# Patient Record
Sex: Female | Born: 1965 | Hispanic: Yes | Marital: Married
Health system: Southern US, Community
[De-identification: ages and names within clinical notes are randomized; demographics above are authoritative.]

## PROBLEM LIST (undated history)

## (undated) HISTORY — PX: BREAST EXCISIONAL BIOPSY: SUR124

---

## 2004-11-09 ENCOUNTER — Ambulatory Visit: Payer: Self-pay

## 2005-07-06 ENCOUNTER — Ambulatory Visit: Payer: Self-pay

## 2006-05-24 ENCOUNTER — Ambulatory Visit: Payer: Self-pay | Admitting: Family Medicine

## 2015-03-21 HISTORY — PX: BREAST EXCISIONAL BIOPSY: SUR124

## 2015-07-14 ENCOUNTER — Ambulatory Visit: Payer: Self-pay | Attending: Oncology

## 2015-07-14 ENCOUNTER — Ambulatory Visit
Admission: RE | Admit: 2015-07-14 | Discharge: 2015-07-14 | Disposition: A | Discharge status: Home / Self Care | Payer: Self-pay | Source: Ambulatory Visit | Attending: Oncology | Admitting: Oncology

## 2015-07-14 VITALS — BP 133/78 | HR 55 | Temp 97.8°F | Resp 16 | Ht 64.57 in | Wt 167.4 lb

## 2015-07-14 DIAGNOSIS — Z0000 Encounter for general adult medical examination without abnormal findings: Secondary | ICD-10-CM

## 2015-07-14 NOTE — Progress Notes (Signed)
Subjective:     Patient ID: Diamantina MonksSalustia Pantaleon Santos, female   DOB: 02/19/1966, 50 y.o.   MRN: [id number]  HPI   Review of Systems     Objective:   Physical Exam  Pulmonary/Chest: Right breast exhibits no inverted nipple, no mass, no nipple discharge, no skin change and no tenderness. Left breast exhibits no inverted nipple, no mass, no nipple discharge, no skin change and no tenderness. Breasts are symmetrical.       Assessment:     50 [facility]  Patient screened, and meets [facility]CP eligibility.  Patient does not have insurance, Medicare or Medicaid.  Handout given on Affordable Care Act.  Instructed patient on breast self-exam using teach back method.  CBE unremarkable.  Claretha CooperLoyda Murr interpreted exam.  Patient had a mamm[facility] here in 2007, and [facility].    Plan:     Sent for bilateral screening mammogram.

## 2015-07-19 ENCOUNTER — Other Ambulatory Visit: Payer: Self-pay

## 2015-07-19 DIAGNOSIS — N63 Unspecified lump in unspecified breast: Secondary | ICD-10-CM

## 2015-08-20 ENCOUNTER — Ambulatory Visit
Admission: RE | Admit: 2015-08-20 | Discharge: 2015-08-20 | Disposition: A | Discharge status: Home / Self Care | Payer: Self-pay | Source: Ambulatory Visit | Attending: Oncology | Admitting: Oncology

## 2015-08-20 DIAGNOSIS — N63 Unspecified lump in unspecified breast: Secondary | ICD-10-CM

## 2015-08-24 ENCOUNTER — Other Ambulatory Visit: Payer: Self-pay

## 2015-08-24 DIAGNOSIS — N63 Unspecified lump in unspecified breast: Secondary | ICD-10-CM

## 2015-09-06 ENCOUNTER — Ambulatory Visit: Payer: Self-pay

## 2015-09-06 ENCOUNTER — Ambulatory Visit
Admission: RE | Admit: 2015-09-06 | Discharge: 2015-09-06 | Disposition: A | Discharge status: Home / Self Care | Payer: Self-pay | Source: Ambulatory Visit | Attending: Oncology | Admitting: Oncology

## 2015-09-06 ENCOUNTER — Ambulatory Visit: Admission: RE | Admit: 2015-09-06 | Payer: Self-pay | Source: Ambulatory Visit

## 2015-09-06 DIAGNOSIS — N63 Unspecified lump in unspecified breast: Secondary | ICD-10-CM

## 2015-09-07 LAB — SURGICAL PATHOLOGY

## 2015-09-09 ENCOUNTER — Encounter: Payer: Self-pay | Admitting: *Deleted

## 2015-09-09 NOTE — Progress Notes (Unsigned)
Biopsy results showed atypical vascular lesion.  Radiologist recommended surgical  excision .  Scheduled consult with Dr. Lemar LivingsByrnett on September 28, 2015 at 3:45.  Patient to arrive at 3:30 with photo ID, and current medications.  Mailed appointment instructions to patient.  Lindsey Cornertto Hernandez interpreted phone call.

## 2015-09-28 ENCOUNTER — Ambulatory Visit: Payer: Self-pay | Admitting: General Surgery

## 2015-09-29 NOTE — Progress Notes (Signed)
Patient had benign vascular lesion on breast biopsy of 09/06/15.  Scheduled appointment for consult with Dr. Lemar LivingsByrnett on 09/28/15 to discuss excision with Robert BellowMaritza Afandor interpreting phone call.  Explained to patient that [facility]CP would not cover excision if a non-cancerous lesion.  Noticed appointment was cancelled.  Per [facility] at [facility], patient called with interpreter on the phone, and cancelled this consult.  [facility]  Copy to [facility].

## 2017-01-24 ENCOUNTER — Encounter (INDEPENDENT_AMBULATORY_CARE_PROVIDER_SITE_OTHER): Payer: Self-pay

## 2017-01-24 ENCOUNTER — Ambulatory Visit
Admission: RE | Admit: 2017-01-24 | Discharge: 2017-01-24 | Disposition: A | Discharge status: Home / Self Care | Payer: Self-pay | Source: Ambulatory Visit | Attending: Oncology | Admitting: Oncology

## 2017-01-24 ENCOUNTER — Ambulatory Visit: Payer: Self-pay | Attending: Oncology

## 2017-01-24 VITALS — BP 129/73 | HR 69 | Temp 96.6°F | Resp 18 | Ht 65.0 in | Wt 165.0 lb

## 2017-01-24 DIAGNOSIS — Z0000 Encounter for general adult medical examination without abnormal findings: Secondary | ICD-10-CM

## 2017-01-24 NOTE — Progress Notes (Signed)
Subjective:     Patient ID: Diamantina MonksSalustia Pantaleon Santos, female   DOB: 1965/09/05, 51 y.o.   MRN: [id number]  HPI   Review of Systems     Objective:   Physical Exam  Pulmonary/Chest: Right breast exhibits no inverted nipple, no mass, no nipple discharge, no skin change and no tenderness. Left breast exhibits no inverted nipple, no mass, no nipple discharge, no skin change and no tenderness. Breasts are symmetrical.    Left breast biopsy scar; horizontal right nipple inversion        Assessment:     51 year old hispanic patient presents for [facility] clinic visit.  Patient screened, and meets BCCCP eligibility.  Patient does not have insurance, Medicare or Medicaid.  Handout given on Affordable Care Act.  Instructed patient on breast self-exam using teach back method.  [facility].  CBE unremarkable.  No mass or lump palpated Jaqui Laukaitis interpreted exam.    Plan:     Sent for bilateral screening mammogram.

## 2017-01-31 NOTE — Progress Notes (Signed)
Letter mailed from [facility] to notify of normal mammogram results.  Patient to return in one year for annual screening.  Copy to [facility]. 

## 2018-11-12 ENCOUNTER — Other Ambulatory Visit: Payer: Self-pay

## 2018-11-13 ENCOUNTER — Ambulatory Visit
Admission: RE | Admit: 2018-11-13 | Discharge: 2018-11-13 | Disposition: A | Discharge status: Home / Self Care | Payer: Self-pay | Source: Ambulatory Visit | Attending: Oncology | Admitting: Oncology

## 2018-11-13 ENCOUNTER — Other Ambulatory Visit: Payer: Self-pay | Admitting: *Deleted

## 2018-11-13 ENCOUNTER — Other Ambulatory Visit: Payer: Self-pay

## 2018-11-13 ENCOUNTER — Encounter: Payer: Self-pay | Admitting: *Deleted

## 2018-11-13 ENCOUNTER — Ambulatory Visit: Payer: Self-pay | Attending: Oncology | Admitting: *Deleted

## 2018-11-13 ENCOUNTER — Encounter (INDEPENDENT_AMBULATORY_CARE_PROVIDER_SITE_OTHER): Payer: Self-pay

## 2018-11-13 VITALS — BP 135/78 | HR 59 | Temp 96.4°F | Resp 16 | Ht 65.75 in | Wt 170.4 lb

## 2018-11-13 DIAGNOSIS — N6489 Other specified disorders of breast: Secondary | ICD-10-CM

## 2018-11-13 DIAGNOSIS — Z0000 Encounter for general adult medical examination without abnormal findings: Secondary | ICD-10-CM | POA: Insufficient documentation

## 2018-11-13 NOTE — Progress Notes (Signed)
  Subjective:     Patient ID: Lindsey Hernandez, female   DOB: [birth year], 53 y.o.   MRN: [id number]  HPI   Review of Systems     Objective:   Physical Exam Chest:     Breasts:        Right: No swelling, bleeding, inverted nipple, mass, nipple discharge, skin change or tenderness.        Left: No swelling, bleeding, inverted nipple, mass, nipple discharge, skin change or tenderness.    Lymphadenopathy:     Upper Body:     Right upper body: No supraclavicular or axillary adenopathy.     Left upper body: No supraclavicular or axillary adenopathy.        Assessment:     53 year old Hispanic female presents to [facility] for clinical breast exam and mammogram only.  Lindsey Hernandez, the interpreter present during the interview and exam.  States she got her pap results yesterday and they were normal.  She is to follow up with next pap per ASCCP guidelines.  Clinical breast exam unremarkable.  Taught self breast awareness.  Tyer-Cuzick breast risk model score is 10.5% lifetime risk of breast cancer.  Patient has been screened for eligibility.  She does not have any insurance, Medicare or Medicaid.  She also meets financial eligibility.  Hand-out given on the Affordable Care Act.    Plan:     Screening mammogram ordered.  Will follow up per [facility] protocol.

## 2018-11-27 ENCOUNTER — Ambulatory Visit
Admission: RE | Admit: 2018-11-27 | Discharge: 2018-11-27 | Disposition: A | Discharge status: Home / Self Care | Payer: Self-pay | Source: Ambulatory Visit | Attending: Oncology | Admitting: Oncology

## 2018-11-27 DIAGNOSIS — N6489 Other specified disorders of breast: Secondary | ICD-10-CM | POA: Insufficient documentation

## 2018-11-28 ENCOUNTER — Encounter: Payer: Self-pay | Admitting: *Deleted

## 2018-11-28 NOTE — Progress Notes (Signed)
Letter mailed from the [facility] to inform patient of her normal mammogram results.  Patient is to follow-up with annual screening in one year.  [facility] to Christy. 

## 2019-11-18 ENCOUNTER — Ambulatory Visit
Admission: RE | Admit: 2019-11-18 | Discharge: 2019-11-18 | Disposition: A | Discharge status: Home / Self Care | Payer: Self-pay | Source: Ambulatory Visit | Attending: Oncology | Admitting: Oncology

## 2019-11-18 ENCOUNTER — Encounter: Payer: Self-pay | Admitting: *Deleted

## 2019-11-18 ENCOUNTER — Ambulatory Visit: Payer: Self-pay | Attending: Oncology | Admitting: *Deleted

## 2019-11-18 ENCOUNTER — Other Ambulatory Visit: Payer: Self-pay

## 2019-11-18 VITALS — BP 132/70 | HR 62 | Temp 97.8°F | Ht 65.0 in | Wt 178.0 lb

## 2019-11-18 DIAGNOSIS — Z0000 Encounter for general adult medical examination without abnormal findings: Secondary | ICD-10-CM | POA: Insufficient documentation

## 2019-11-18 NOTE — Progress Notes (Signed)
  Subjective:     Patient ID: Lindsey Hernandez, female   DOB: 1965-09-11, 54 y.o.   MRN: [id number]  HPI   [facility] Medical History Record - 11/18/19 9371      Breast History   Screening cycle Rescreen    CBE Date 11/27/18    Provider (CBE) [facility]    Initial Mammogram 11/18/19    Last Mammogram Annual    Last Mammogram Date 11/27/18    Provider (Mammogram)  Delford Field    Recent Breast Symptoms None      Breast Cancer History   Breast Cancer History No personal or family history      Previous History of Breast Problems   Breast Surgery or Biopsy Left    Breast Implants N/A    BSE Done See comments    Additional Comments (free text)  weekly      Gynecological/Obstetrical History   LMP --   2012   Is there any chance that the client could be pregnant?  No    Age at menarche 55    Age at menopause 54    PAP smear history Annually    Date of last PAP  10/23/18    Provider (PAP) [facility]    Age at first live birth 27    Breast fed children Yes (type length in comments)   1 year   DES Exposure Unkown    Cervical, Uterine or Ovarian cancer No    Family history of Cervial, Uterine or Ovarian cancer No    Hysterectomy No    Cervix removed No    Ovaries removed No    Laser/Cryosurgery No    Current method of birth control None    Current method of Estrogen/Hormone replacement None    Smoking history None    Comments No ins / 6 in [facility] / $2200/mo             Review of Systems     Objective:   Physical Exam Chest:     Breasts:        Right: No swelling, bleeding, inverted nipple, mass, nipple discharge, skin change or tenderness.        Left: No swelling, bleeding, inverted nipple, mass, nipple discharge, skin change or tenderness.    Lymphadenopathy:     Upper Body:     Right upper body: No supraclavicular or axillary adenopathy.     Left upper body: No supraclavicular or axillary adenopathy.        Assessment:     54 year old Hispanic patient  returns to [facility] for annual screening.  Lindsey Hernandez, the interpreter present during the interview and exam.  Clinical breast exam without dominant mass, skin changes, nipple discharge or lymphadenopathy.  Taught self breast awareness.  Last pap on 10/23/18 was negative without HPV co-testing.  Next pap in 2023.  Patient has been screened for eligibility.  She does not have any insurance, Medicare or Medicaid.  She also meets financial eligibility.   Risk Assessment    Risk Scores      11/18/2019 11/13/2018   Last edited by: Lindsey Hernandez, CMA Lindsey Like, RN   5-year risk: 1 % 1 %   Lifetime risk: 7.3 % 7.5 %            Plan:     Screening mammogram ordered.  Will follow up per [facility] protocol.

## 2019-11-18 NOTE — Patient Instructions (Signed)
Gave patient hand-out, Women Staying Healthy, Active and Well from [facility], with education on breast health, pap smears, heart and colon health. 

## 2019-11-19 ENCOUNTER — Encounter: Payer: Self-pay | Admitting: *Deleted

## 2019-11-19 NOTE — Progress Notes (Signed)
Letter mailed from the [facility] to inform patient of her normal mammogram results.  Patient is to follow-up with annual screening in one year. 

## 2020-09-12 IMAGING — MG MM DIGITAL DIAGNOSTIC BILAT W/ TOMO W/ CAD
8 of 14 series · 8 of 40 positions shown · non-contrast
Comparison: Previous exam(s).

CLINICAL DATA: Recall from screening mammogram for questioned
asymmetries in both breasts.

EXAM:
DIGITAL DIAGNOSTIC BILATERAL MAMMOGRAM WITH TOMO
ULTRASOUND BILATERAL BREAST

[R ML synth-2D]
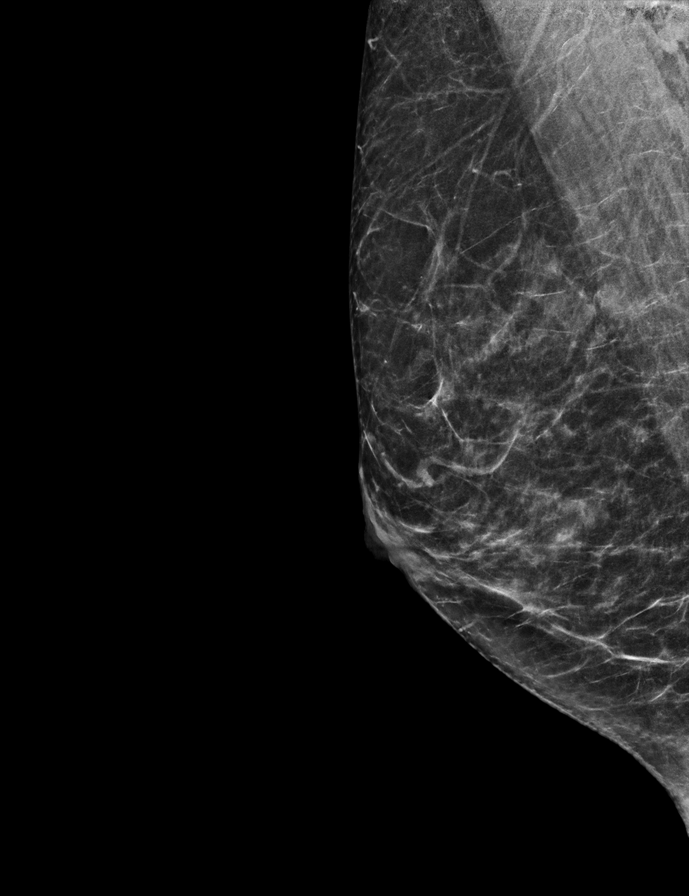

[L ML synth-2D]
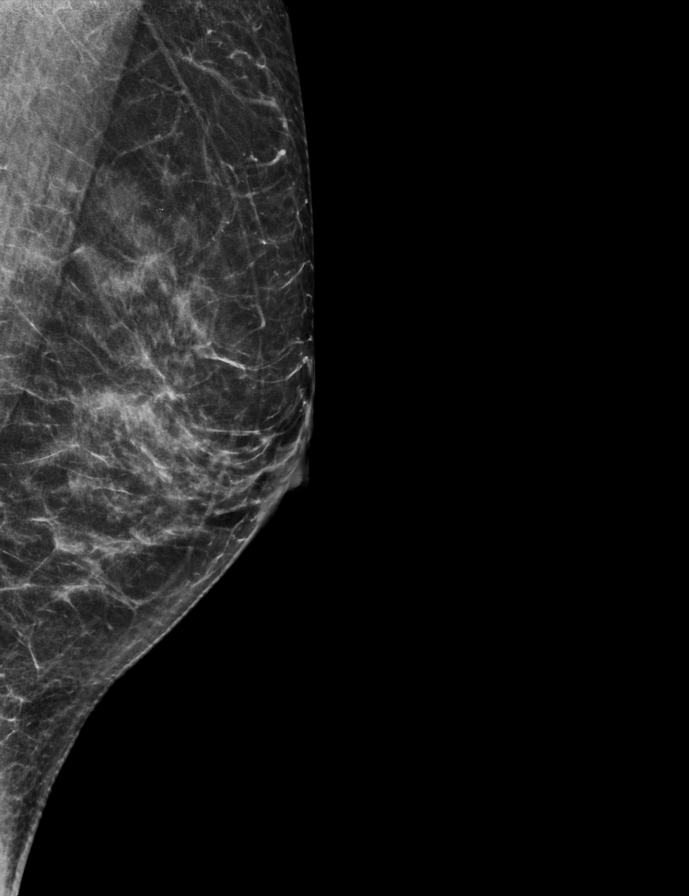

[L MLO synth-2D]
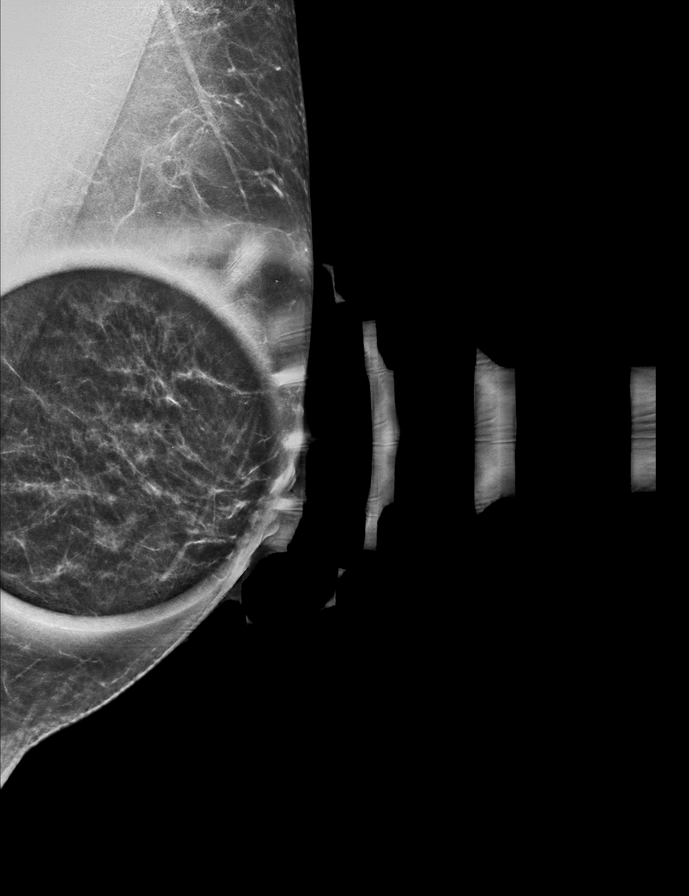

[R CC synth-2D (1 of 2)]
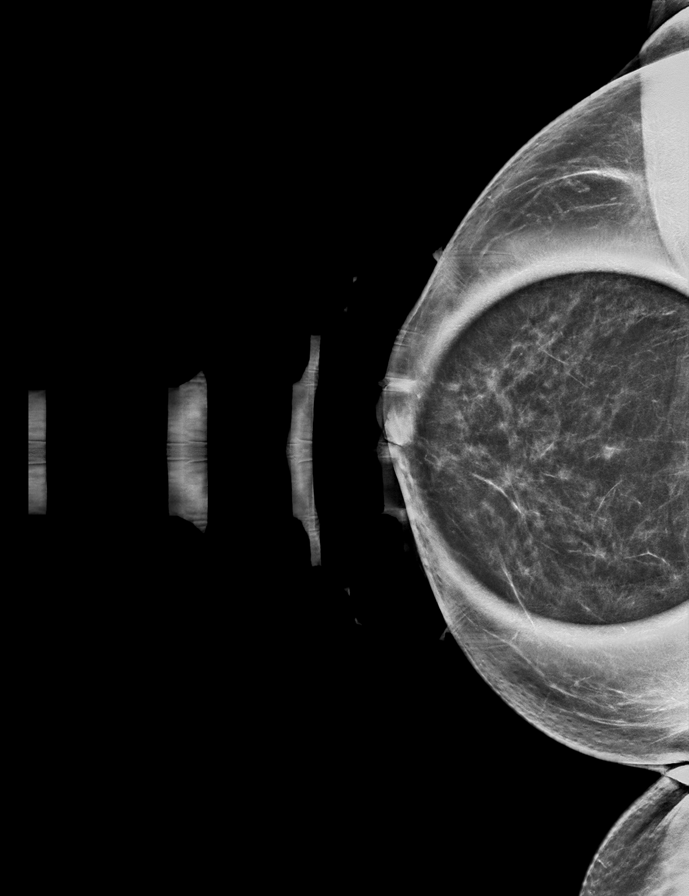

[R CC synth-2D (2 of 2)]
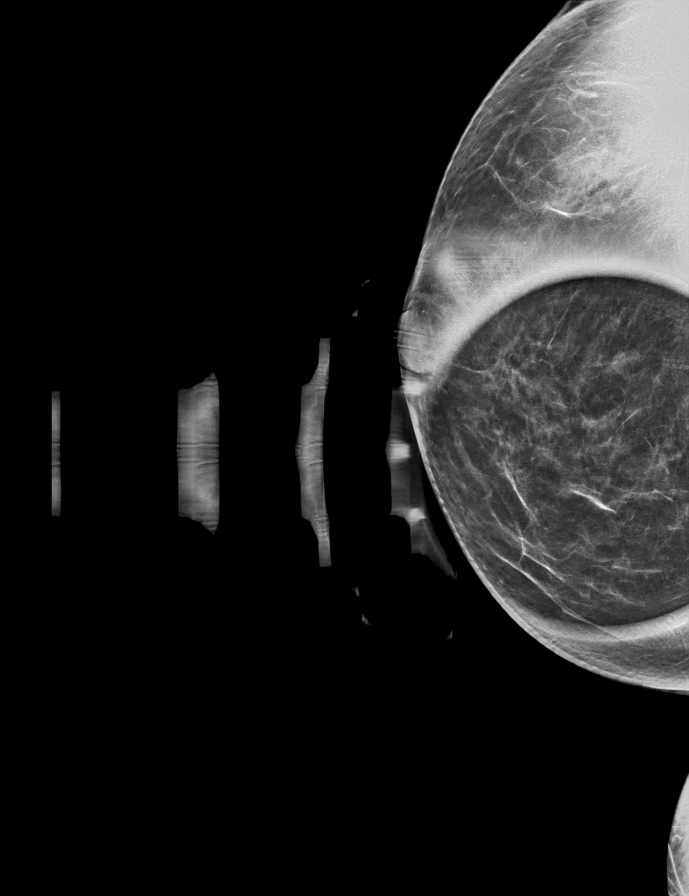

[L CC synth-2D]
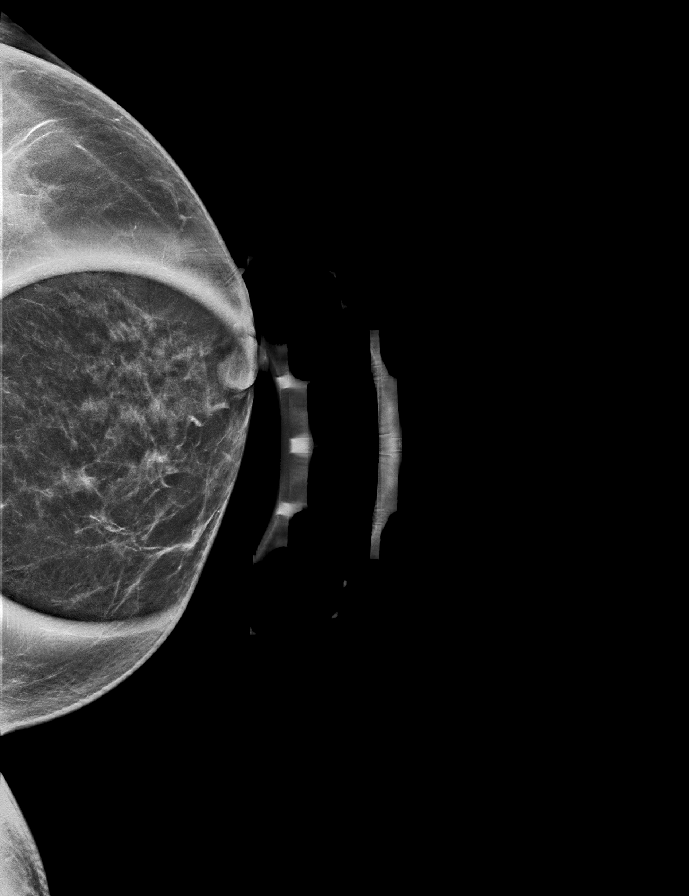

[R MLO synth-2D]
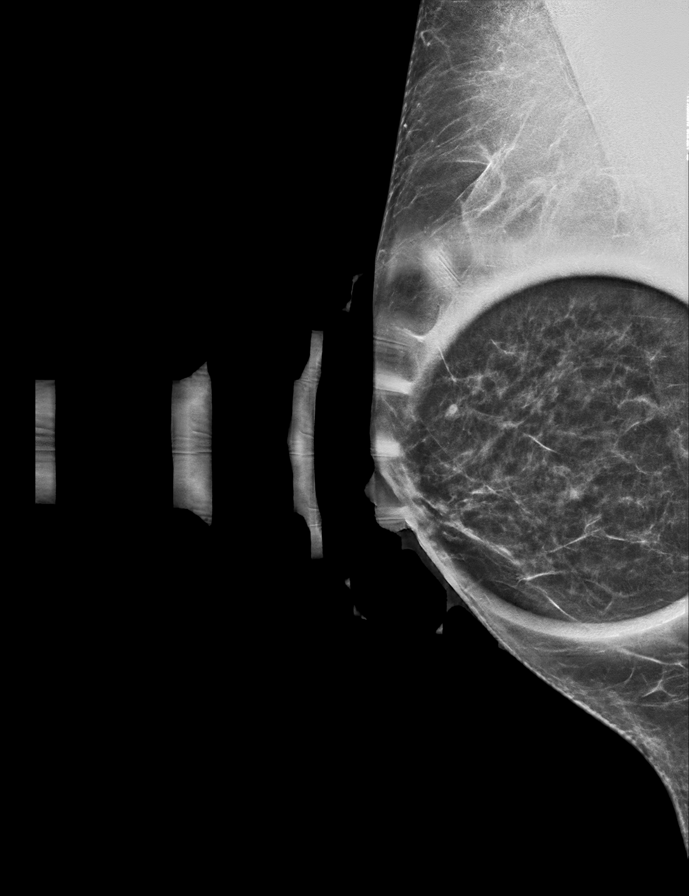

[R ML tomo · tomo slice 29/58.0]
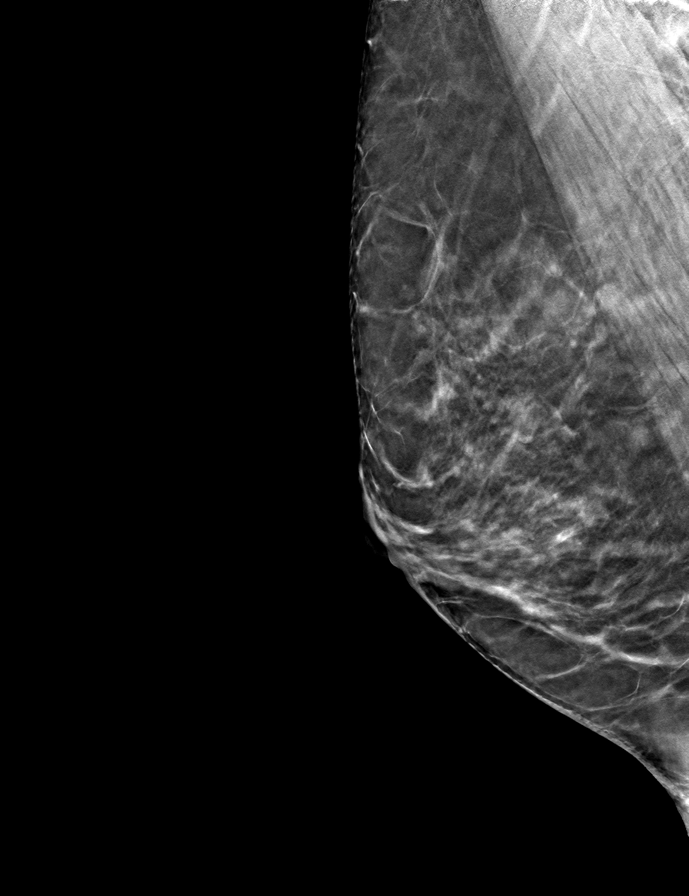

[8 of 40 positions shown; findings below may reference images not displayed]

ACR Breast Density Category c: The breast tissue is heterogeneously
dense, which may obscure small masses.
FINDINGS: Additional views including spot compression demonstrate the presence
of an oval circumscribed 6 mm mass in the central right breast at
posterior depth.

The previously described asymmetry in the medial right breast does
not persist with additional views, consistent with superimposed
fibroglandular tissue.

The previously described asymmetry in the left breast does not
persist with additional views, consistent with superimposed
fibroglandular tissue.

Targeted bilateral ultrasound was performed.

In the right breast at 6 o'clock in the retroareolar position a
benign simple cyst measures 5 x 2 x 3 mm. This corresponds to the
circumscribed oval mass seen on the mammogram.

In the left breast from the 9 o'clock to the 12 o'clock position no
suspicious solid or cystic mass is identified to corresponds to the
previously questioned asymmetry in this area. Normal appearing milk
ducts are identified in the retroareolar breast.
IMPRESSION: Benign cyst in the right breast. No evidence of malignancy in either
breast.

RECOMMENDATION:
Recommend routine annual screening mammogram in 1 year.

I have discussed the findings and recommendations with the patient.
If applicable, a reminder letter will be sent to the patient
regarding the next appointment.

BI-RADS CATEGORY  2: Benign.

## 2020-09-12 IMAGING — US US BREAST*R* LIMITED INC AXILLA
1 series · 5 of 5 positions shown · non-contrast
Comparison: Previous exam(s).

CLINICAL DATA: Recall from screening mammogram for questioned
asymmetries in both breasts.

EXAM:
DIGITAL DIAGNOSTIC BILATERAL MAMMOGRAM WITH TOMO
ULTRASOUND BILATERAL BREAST

[Series 1: us breast*right* limited inc axilla · 0.03mm/px · 5 of 5 slices shown]
[im 1/5]
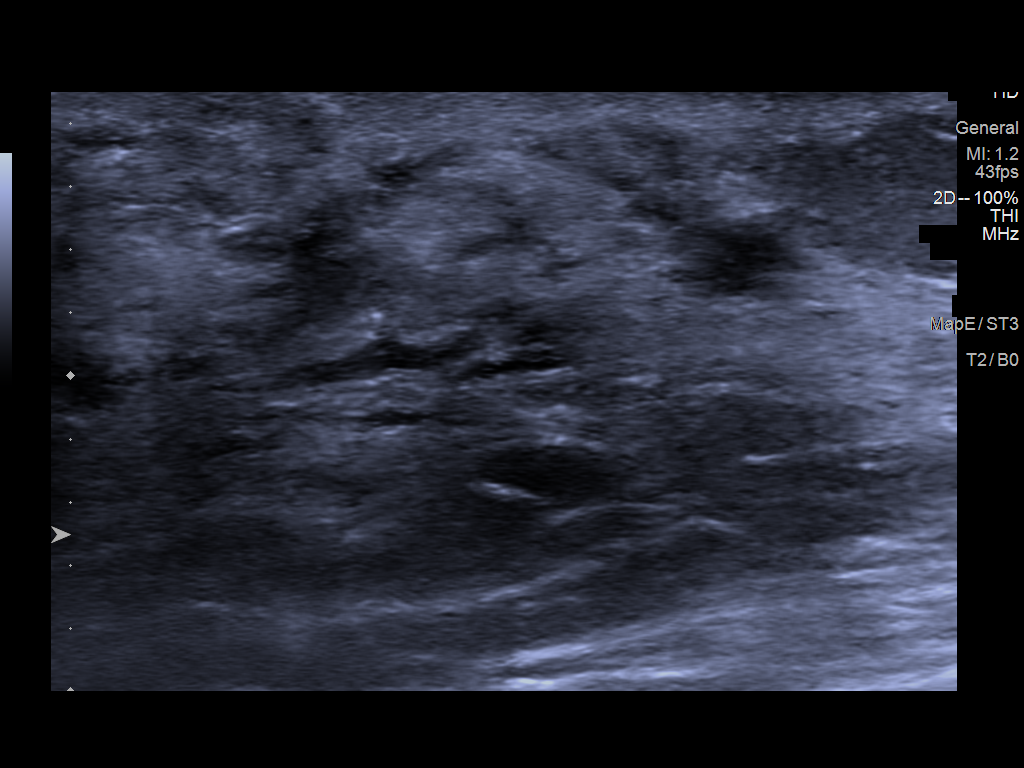
[im 2/5]
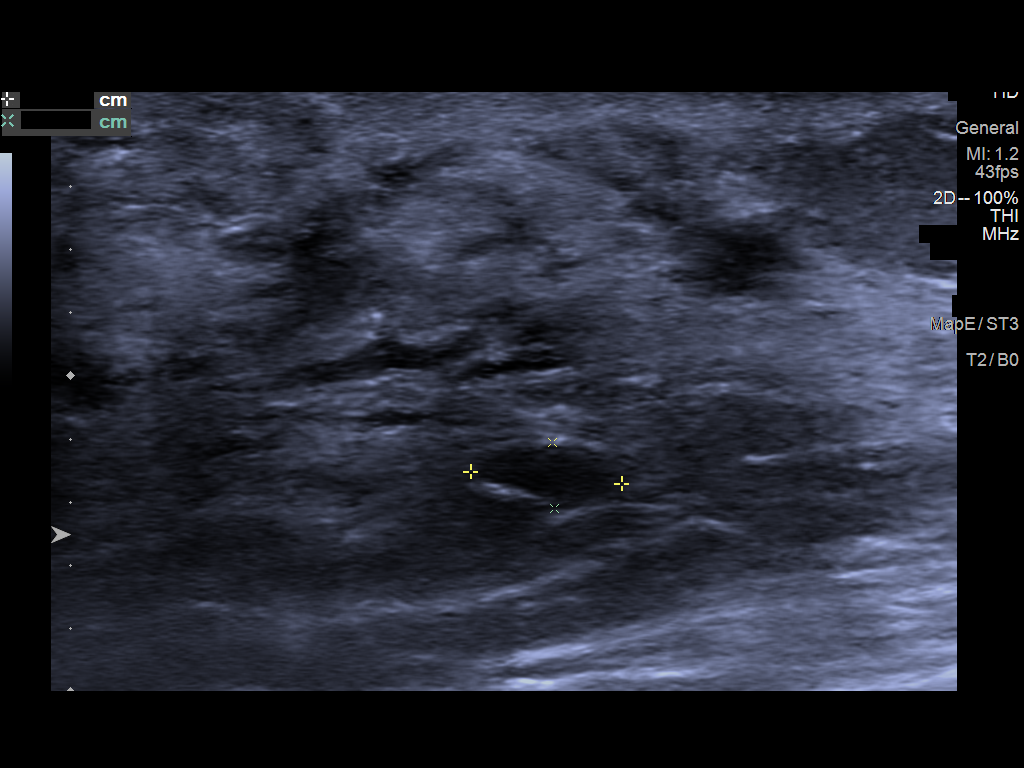
[im 3/5]
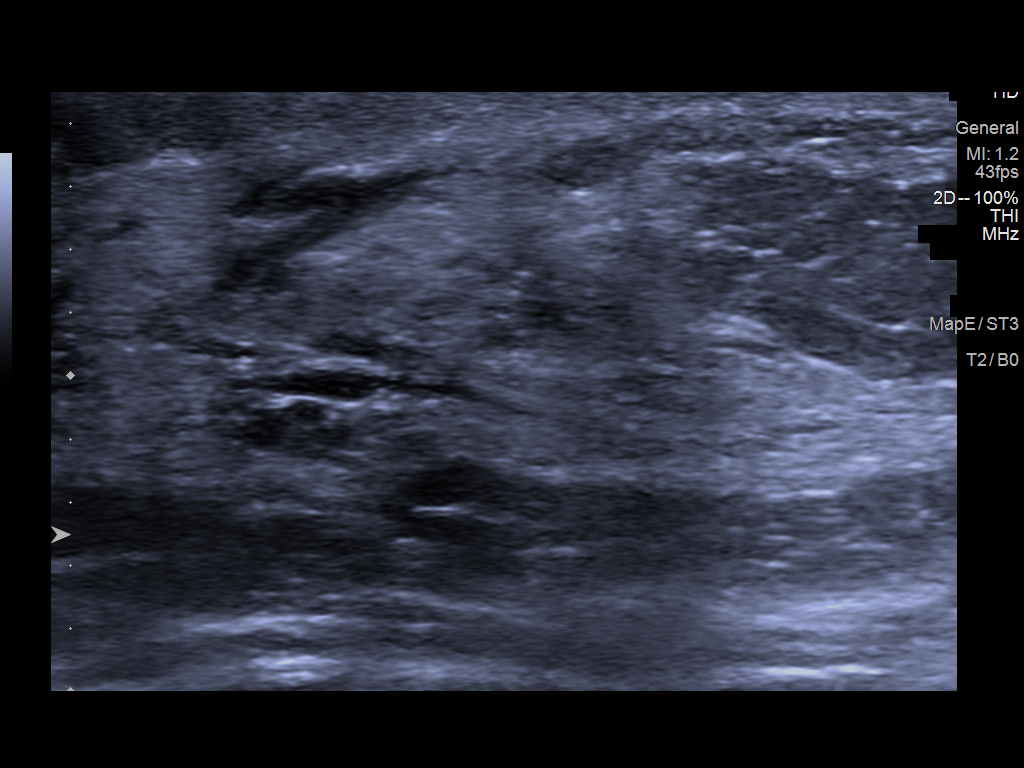
[im 4/5]
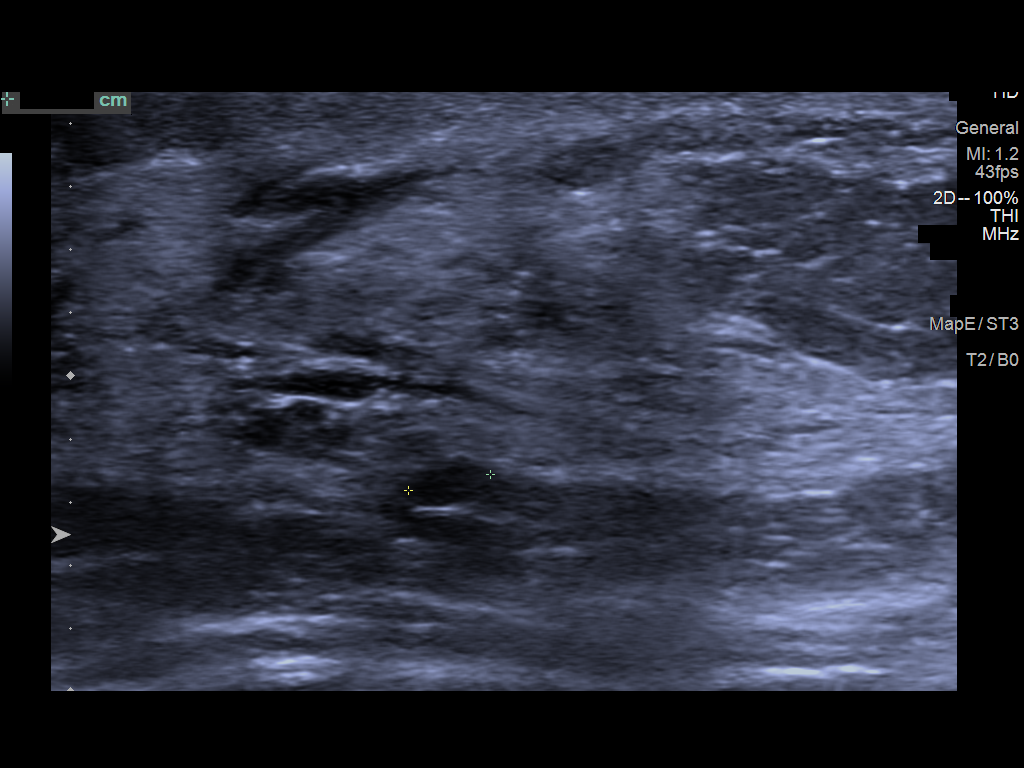
[im 5/5]
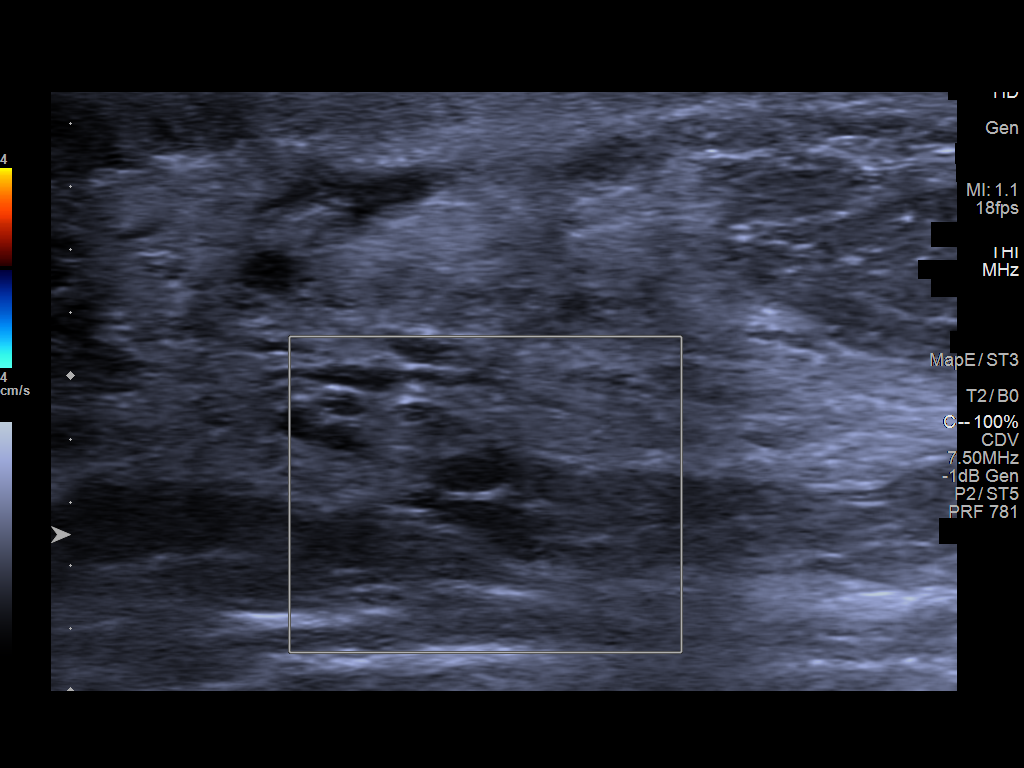

[5 of 5 positions shown; findings below may reference images not displayed]

ACR Breast Density Category c: The breast tissue is heterogeneously
dense, which may obscure small masses.
FINDINGS: Additional views including spot compression demonstrate the presence
of an oval circumscribed 6 mm mass in the central right breast at
posterior depth.

The previously described asymmetry in the medial right breast does
not persist with additional views, consistent with superimposed
fibroglandular tissue.

The previously described asymmetry in the left breast does not
persist with additional views, consistent with superimposed
fibroglandular tissue.

Targeted bilateral ultrasound was performed.

In the right breast at 6 o'clock in the retroareolar position a
benign simple cyst measures 5 x 2 x 3 mm. This corresponds to the
circumscribed oval mass seen on the mammogram.

In the left breast from the 9 o'clock to the 12 o'clock position no
suspicious solid or cystic mass is identified to corresponds to the
previously questioned asymmetry in this area. Normal appearing milk
ducts are identified in the retroareolar breast.
IMPRESSION: Benign cyst in the right breast. No evidence of malignancy in either
breast.

RECOMMENDATION:
Recommend routine annual screening mammogram in 1 year.

I have discussed the findings and recommendations with the patient.
If applicable, a reminder letter will be sent to the patient
regarding the next appointment.

BI-RADS CATEGORY  2: Benign.

## 2020-09-12 IMAGING — US US BREAST*L* LIMITED INC AXILLA
1 series · 7 of 7 positions shown · non-contrast
Comparison: Previous exam(s).

CLINICAL DATA: Recall from screening mammogram for questioned
asymmetries in both breasts.

EXAM:
DIGITAL DIAGNOSTIC BILATERAL MAMMOGRAM WITH TOMO
ULTRASOUND BILATERAL BREAST

[Series 1: us breast*left* limited inc axilla · 0.05mm/px · 7 of 7 slices shown]
[im 1/7]
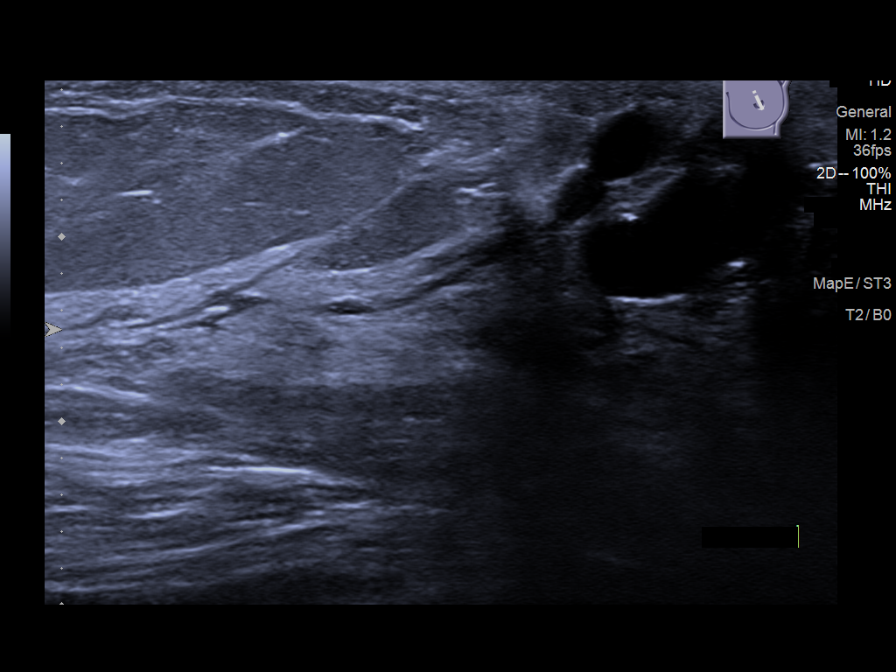
[im 2/7]
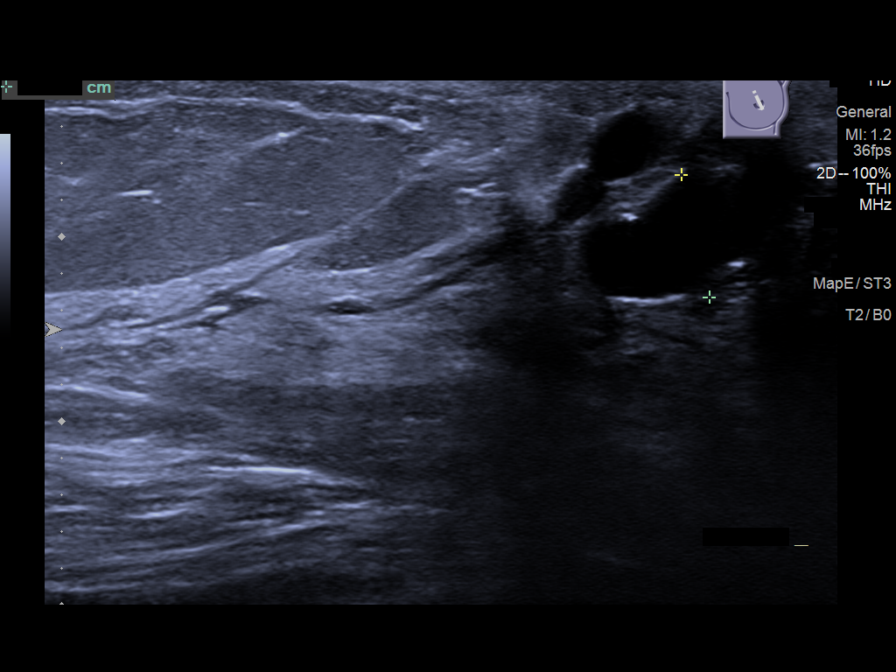
[im 3/7]
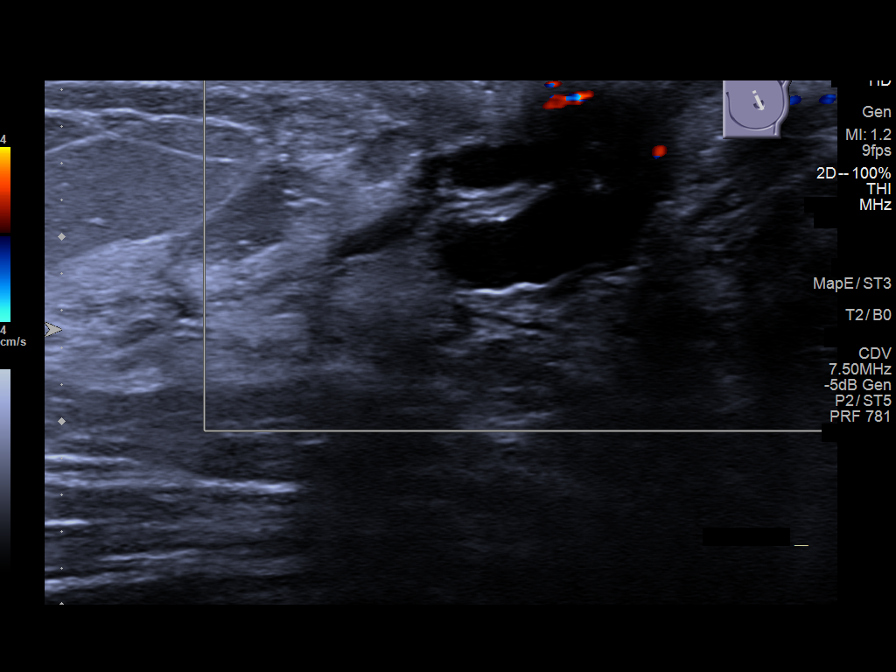
[im 4/7]
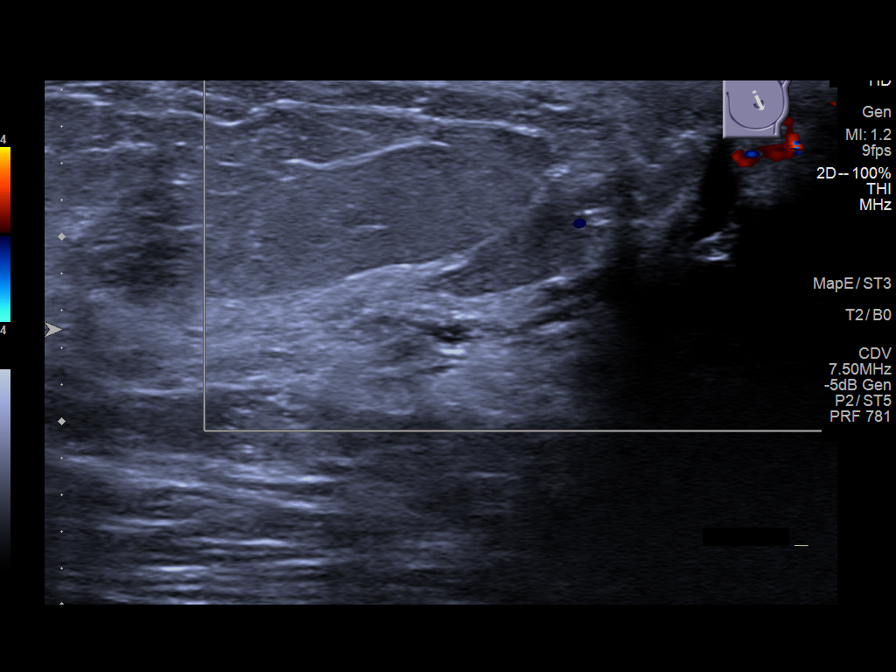
[im 5/7]
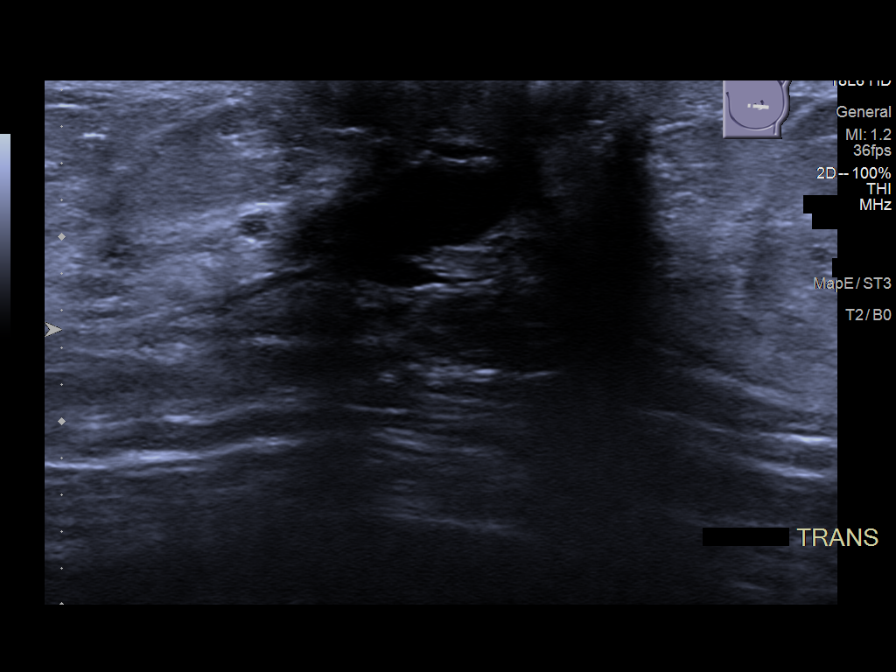
[im 6/7]
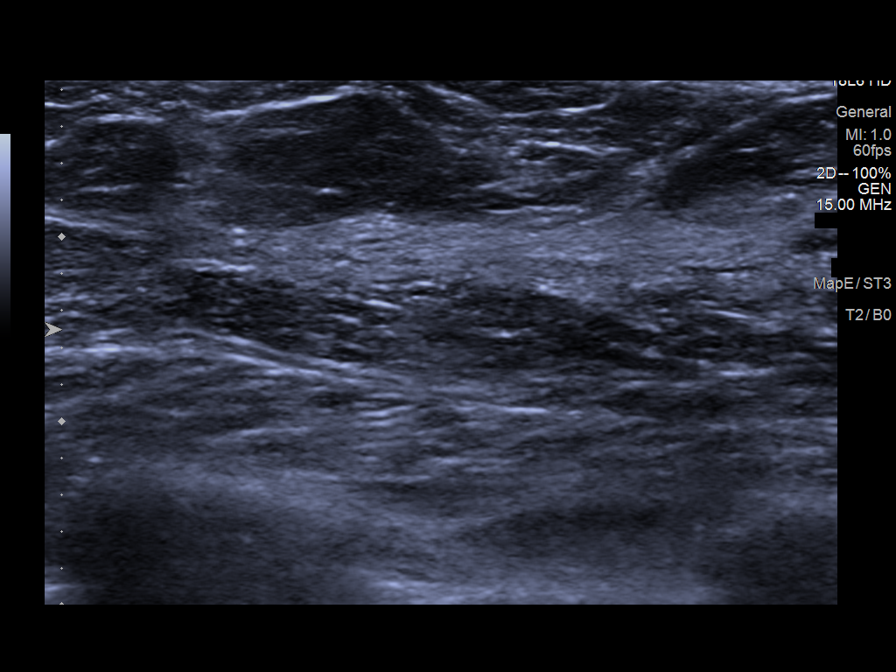
[im 7/7]
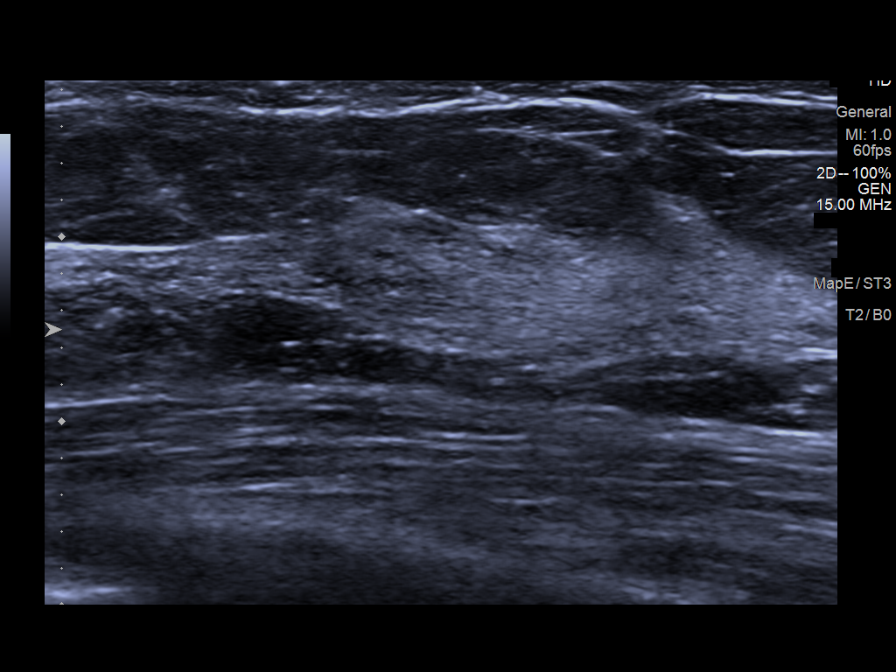

[7 of 7 positions shown; findings below may reference images not displayed]

ACR Breast Density Category c: The breast tissue is heterogeneously
dense, which may obscure small masses.
FINDINGS: Additional views including spot compression demonstrate the presence
of an oval circumscribed 6 mm mass in the central right breast at
posterior depth.

The previously described asymmetry in the medial right breast does
not persist with additional views, consistent with superimposed
fibroglandular tissue.

The previously described asymmetry in the left breast does not
persist with additional views, consistent with superimposed
fibroglandular tissue.

Targeted bilateral ultrasound was performed.

In the right breast at 6 o'clock in the retroareolar position a
benign simple cyst measures 5 x 2 x 3 mm. This corresponds to the
circumscribed oval mass seen on the mammogram.

In the left breast from the 9 o'clock to the 12 o'clock position no
suspicious solid or cystic mass is identified to corresponds to the
previously questioned asymmetry in this area. Normal appearing milk
ducts are identified in the retroareolar breast.
IMPRESSION: Benign cyst in the right breast. No evidence of malignancy in either
breast.

RECOMMENDATION:
Recommend routine annual screening mammogram in 1 year.

I have discussed the findings and recommendations with the patient.
If applicable, a reminder letter will be sent to the patient
regarding the next appointment.

BI-RADS CATEGORY  2: Benign.

## 2024-01-02 ENCOUNTER — Telehealth: Payer: Self-pay

## 2024-01-02 ENCOUNTER — Encounter: Payer: Self-pay | Admitting: Nurse Practitioner

## 2024-01-23 ENCOUNTER — Other Ambulatory Visit: Payer: Self-pay | Admitting: Nurse Practitioner

## 2024-01-23 DIAGNOSIS — Z1231 Encounter for screening mammogram for malignant neoplasm of breast: Secondary | ICD-10-CM

## 2024-03-03 ENCOUNTER — Ambulatory Visit
Admission: RE | Admit: 2024-03-03 | Discharge: 2024-03-03 | Disposition: A | Payer: Self-pay | Source: Ambulatory Visit | Attending: Nurse Practitioner

## 2024-03-03 DIAGNOSIS — Z1231 Encounter for screening mammogram for malignant neoplasm of breast: Secondary | ICD-10-CM
